# Patient Record
Sex: Male | Born: 1935 | Race: White | Hispanic: No | Marital: Married
Health system: Southern US, Community
[De-identification: ages and names within clinical notes are randomized; demographics above are authoritative.]

---

## 2005-07-09 ENCOUNTER — Ambulatory Visit: Payer: Self-pay | Admitting: Internal Medicine

## 2007-10-01 ENCOUNTER — Ambulatory Visit: Payer: Self-pay | Admitting: Family Medicine

## 2008-07-23 ENCOUNTER — Inpatient Hospital Stay: Payer: Self-pay | Admitting: Internal Medicine

## 2008-12-07 ENCOUNTER — Ambulatory Visit: Payer: Self-pay | Admitting: Ophthalmology

## 2008-12-14 ENCOUNTER — Ambulatory Visit: Payer: Self-pay | Admitting: Ophthalmology

## 2009-02-28 ENCOUNTER — Ambulatory Visit: Payer: Self-pay | Admitting: Ophthalmology

## 2010-07-13 ENCOUNTER — Ambulatory Visit: Payer: Self-pay | Admitting: Internal Medicine

## 2010-07-20 ENCOUNTER — Inpatient Hospital Stay: Payer: Self-pay | Admitting: Internal Medicine

## 2010-07-20 DIAGNOSIS — R0602 Shortness of breath: Secondary | ICD-10-CM

## 2011-03-14 ENCOUNTER — Inpatient Hospital Stay: Payer: Self-pay | Admitting: Internal Medicine

## 2011-04-05 ENCOUNTER — Ambulatory Visit: Payer: Self-pay | Admitting: Internal Medicine

## 2011-10-30 ENCOUNTER — Ambulatory Visit: Payer: Self-pay

## 2011-11-21 ENCOUNTER — Ambulatory Visit: Payer: Self-pay

## 2012-07-17 ENCOUNTER — Emergency Department: Payer: Self-pay | Admitting: Emergency Medicine

## 2013-11-12 ENCOUNTER — Ambulatory Visit: Payer: Self-pay | Admitting: Physician Assistant

## 2013-11-24 ENCOUNTER — Inpatient Hospital Stay: Payer: Self-pay | Admitting: Internal Medicine

## 2013-11-24 LAB — CBC WITH DIFFERENTIAL/PLATELET
BASOS ABS: 0 10*3/uL (ref 0.0–0.1)
Basophil %: 0.2 %
EOS PCT: 0 %
Eosinophil #: 0 10*3/uL (ref 0.0–0.7)
HCT: 44.3 % (ref 40.0–52.0)
HGB: 14.3 g/dL (ref 13.0–18.0)
LYMPHS ABS: 0.6 10*3/uL — AB (ref 1.0–3.6)
LYMPHS PCT: 3.2 %
MCH: 30.5 pg (ref 26.0–34.0)
MCHC: 32.4 g/dL (ref 32.0–36.0)
MCV: 94 fL (ref 80–100)
MONO ABS: 0.9 x10 3/mm (ref 0.2–1.0)
MONOS PCT: 4.9 %
NEUTROS ABS: 17.4 10*3/uL — AB (ref 1.4–6.5)
NEUTROS PCT: 91.7 %
Platelet: 219 10*3/uL (ref 150–440)
RBC: 4.7 10*6/uL (ref 4.40–5.90)
RDW: 14 % (ref 11.5–14.5)
WBC: 19 10*3/uL — ABNORMAL HIGH (ref 3.8–10.6)

## 2013-11-24 LAB — COMPREHENSIVE METABOLIC PANEL
ALT: 17 U/L (ref 12–78)
Albumin: 2.9 g/dL — ABNORMAL LOW (ref 3.4–5.0)
Alkaline Phosphatase: 56 U/L
Anion Gap: 6 — ABNORMAL LOW (ref 7–16)
BILIRUBIN TOTAL: 0.3 mg/dL (ref 0.2–1.0)
BUN: 20 mg/dL — ABNORMAL HIGH (ref 7–18)
CALCIUM: 9.2 mg/dL (ref 8.5–10.1)
Chloride: 102 mmol/L (ref 98–107)
Co2: 31 mmol/L (ref 21–32)
Creatinine: 0.99 mg/dL (ref 0.60–1.30)
EGFR (African American): >60
Glucose: 123 mg/dL — ABNORMAL HIGH (ref 65–99)
Osmolality: 282 (ref 275–301)
Potassium: 3.9 mmol/L (ref 3.5–5.1)
SGOT(AST): 18 U/L (ref 15–37)
SODIUM: 139 mmol/L (ref 136–145)
TOTAL PROTEIN: 6.4 g/dL (ref 6.4–8.2)

## 2013-11-24 LAB — THEOPHYLLINE LEVEL: Theophylline: 3.4 ug/mL — ABNORMAL LOW (ref 10.0–20.0)

## 2013-11-25 LAB — BASIC METABOLIC PANEL
ANION GAP: 7 (ref 7–16)
BUN: 22 mg/dL — AB (ref 7–18)
Calcium, Total: 8.6 mg/dL (ref 8.5–10.1)
Chloride: 105 mmol/L (ref 98–107)
Co2: 31 mmol/L (ref 21–32)
Creatinine: 0.95 mg/dL (ref 0.60–1.30)
EGFR (Non-African Amer.): >60
Glucose: 183 mg/dL — ABNORMAL HIGH (ref 65–99)
OSMOLALITY: 293 (ref 275–301)
Potassium: 4.1 mmol/L (ref 3.5–5.1)
Sodium: 143 mmol/L (ref 136–145)

## 2013-11-25 LAB — CBC WITH DIFFERENTIAL/PLATELET
Basophil #: 0 10*3/uL (ref 0.0–0.1)
Basophil %: 0 %
EOS ABS: 0 10*3/uL (ref 0.0–0.7)
Eosinophil %: 0 %
HCT: 40.8 % (ref 40.0–52.0)
HGB: 13.6 g/dL (ref 13.0–18.0)
LYMPHS ABS: 0.4 10*3/uL — AB (ref 1.0–3.6)
Lymphocyte %: 2.3 %
MCH: 31.4 pg (ref 26.0–34.0)
MCHC: 33.2 g/dL (ref 32.0–36.0)
MCV: 95 fL (ref 80–100)
MONO ABS: 0.3 x10 3/mm (ref 0.2–1.0)
Monocyte %: 1.9 %
NEUTROS PCT: 95.8 %
Neutrophil #: 14.7 10*3/uL — ABNORMAL HIGH (ref 1.4–6.5)
PLATELETS: 208 10*3/uL (ref 150–440)
RBC: 4.31 10*6/uL — ABNORMAL LOW (ref 4.40–5.90)
RDW: 14 % (ref 11.5–14.5)
WBC: 15.4 10*3/uL — ABNORMAL HIGH (ref 3.8–10.6)

## 2013-11-28 LAB — CREATININE, SERUM
CREATININE: 0.82 mg/dL (ref 0.60–1.30)
EGFR (Non-African Amer.): >60

## 2013-11-29 LAB — CBC WITH DIFFERENTIAL/PLATELET
BASOS ABS: 0 10*3/uL (ref 0.0–0.1)
Basophil %: 0.1 %
Eosinophil #: 0 10*3/uL (ref 0.0–0.7)
Eosinophil %: 0 %
HCT: 41.6 % (ref 40.0–52.0)
HGB: 13.4 g/dL (ref 13.0–18.0)
Lymphocyte #: 0.3 10*3/uL — ABNORMAL LOW (ref 1.0–3.6)
Lymphocyte %: 1.5 %
MCH: 30.6 pg (ref 26.0–34.0)
MCHC: 32.3 g/dL (ref 32.0–36.0)
MCV: 95 fL (ref 80–100)
MONO ABS: 0.7 x10 3/mm (ref 0.2–1.0)
Monocyte %: 4.1 %
NEUTROS PCT: 94.3 %
Neutrophil #: 16.5 10*3/uL — ABNORMAL HIGH (ref 1.4–6.5)
Platelet: 178 10*3/uL (ref 150–440)
RBC: 4.38 10*6/uL — AB (ref 4.40–5.90)
RDW: 13.9 % (ref 11.5–14.5)
WBC: 17.5 10*3/uL — AB (ref 3.8–10.6)

## 2013-11-29 LAB — BASIC METABOLIC PANEL
Anion Gap: 3 — ABNORMAL LOW (ref 7–16)
BUN: 25 mg/dL — AB (ref 7–18)
CO2: 37 mmol/L — AB (ref 21–32)
Calcium, Total: 9.6 mg/dL (ref 8.5–10.1)
Chloride: 103 mmol/L (ref 98–107)
Creatinine: 0.9 mg/dL (ref 0.60–1.30)
EGFR (African American): >60
Glucose: 117 mg/dL — ABNORMAL HIGH (ref 65–99)
OSMOLALITY: 290 (ref 275–301)
Potassium: 4 mmol/L (ref 3.5–5.1)
SODIUM: 143 mmol/L (ref 136–145)

## 2013-12-02 LAB — BASIC METABOLIC PANEL
Anion Gap: 5 — ABNORMAL LOW (ref 7–16)
BUN: 26 mg/dL — ABNORMAL HIGH (ref 7–18)
CALCIUM: 8.6 mg/dL (ref 8.5–10.1)
CHLORIDE: 102 mmol/L (ref 98–107)
CO2: 37 mmol/L — AB (ref 21–32)
CREATININE: 0.95 mg/dL (ref 0.60–1.30)
EGFR (African American): >60
GLUCOSE: 232 mg/dL — AB (ref 65–99)
Osmolality: 299 (ref 275–301)
POTASSIUM: 3.9 mmol/L (ref 3.5–5.1)
Sodium: 144 mmol/L (ref 136–145)

## 2013-12-02 LAB — CBC WITH DIFFERENTIAL/PLATELET
Basophil #: 0 10*3/uL (ref 0.0–0.1)
Basophil %: 0.1 %
EOS ABS: 0 10*3/uL (ref 0.0–0.7)
EOS PCT: 0 %
HCT: 41 % (ref 40.0–52.0)
HGB: 13.4 g/dL (ref 13.0–18.0)
LYMPHS PCT: 1.7 %
Lymphocyte #: 0.3 10*3/uL — ABNORMAL LOW (ref 1.0–3.6)
MCH: 31 pg (ref 26.0–34.0)
MCHC: 32.7 g/dL (ref 32.0–36.0)
MCV: 95 fL (ref 80–100)
Monocyte #: 0.5 x10 3/mm (ref 0.2–1.0)
Monocyte %: 3.3 %
NEUTROS PCT: 94.9 %
Neutrophil #: 15.3 10*3/uL — ABNORMAL HIGH (ref 1.4–6.5)
PLATELETS: 163 10*3/uL (ref 150–440)
RBC: 4.33 10*6/uL — AB (ref 4.40–5.90)
RDW: 14.2 % (ref 11.5–14.5)
WBC: 16.1 10*3/uL — ABNORMAL HIGH (ref 3.8–10.6)

## 2013-12-02 LAB — EXPECTORATED SPUTUM ASSESSMENT W GRAM STAIN, RFLX TO RESP C

## 2013-12-04 LAB — CBC WITH DIFFERENTIAL/PLATELET
Bands: 5 %
HCT: 44.1 % (ref 40.0–52.0)
HGB: 14.5 g/dL (ref 13.0–18.0)
LYMPHS PCT: 5 %
MCH: 31.1 pg (ref 26.0–34.0)
MCHC: 33 g/dL (ref 32.0–36.0)
MCV: 94 fL (ref 80–100)
Metamyelocyte: 3 %
Monocytes: 2 %
Platelet: 158 10*3/uL (ref 150–440)
RBC: 4.68 10*6/uL (ref 4.40–5.90)
RDW: 14 % (ref 11.5–14.5)
SEGMENTED NEUTROPHILS: 84 %
VARIANT LYMPHOCYTE - H1-RLYMPH: 1 %
WBC: 20 10*3/uL — ABNORMAL HIGH (ref 3.8–10.6)

## 2013-12-05 LAB — CBC WITH DIFFERENTIAL/PLATELET
Basophil #: 0 10*3/uL (ref 0.0–0.1)
Basophil %: 0.1 %
EOS PCT: 0 %
Eosinophil #: 0 10*3/uL (ref 0.0–0.7)
HCT: 43.1 % (ref 40.0–52.0)
HGB: 13.9 g/dL (ref 13.0–18.0)
Lymphocyte #: 0.5 10*3/uL — ABNORMAL LOW (ref 1.0–3.6)
Lymphocyte %: 2.6 %
MCH: 30.5 pg (ref 26.0–34.0)
MCHC: 32.3 g/dL (ref 32.0–36.0)
MCV: 94 fL (ref 80–100)
Monocyte #: 0.7 x10 3/mm (ref 0.2–1.0)
Monocyte %: 3.3 %
NEUTROS ABS: 18.8 10*3/uL — AB (ref 1.4–6.5)
NEUTROS PCT: 94 %
Platelet: 140 10*3/uL — ABNORMAL LOW (ref 150–440)
RBC: 4.58 10*6/uL (ref 4.40–5.90)
RDW: 14.3 % (ref 11.5–14.5)
WBC: 20 10*3/uL — ABNORMAL HIGH (ref 3.8–10.6)

## 2013-12-06 LAB — CREATININE, SERUM
Creatinine: 1.24 mg/dL (ref 0.60–1.30)
EGFR (African American): >60
EGFR (Non-African Amer.): 53 — ABNORMAL LOW

## 2013-12-24 LAB — EXPECTORATED SPUTUM ASSESSMENT W REFEX TO RESP CULTURE

## 2014-02-16 ENCOUNTER — Ambulatory Visit: Payer: Self-pay | Admitting: Internal Medicine

## 2014-10-02 NOTE — Consult Note (Signed)
Brief Consult Note: Diagnosis: severe copd, on chronic prednisone with fungal growth on sputum.   Patient was seen by consultant.   Consult note dictated.   Recommend further assessment or treatment.   Orders entered.   Discussed with Attending MD.   Comments: Check CT chest without contrast I have spoken with micro lab and being sent for further micro eval Consider  voriconazole.  Electronic Signatures: Dierdre HarnessFitzgerald, David Patrick (MD)  (Signed 23-Jun-15 15:37)  Authored: Brief Consult Note   Last Updated: 23-Jun-15 15:37 by Dierdre HarnessFitzgerald, David Patrick (MD)

## 2014-10-02 NOTE — Consult Note (Signed)
Chief Complaint:  Subjective/Chief Complaint patient seen on rounds. doing about the same. some slight improvement in cough production but still with thick tenacious sputum   VITAL SIGNS/ANCILLARY NOTES: **Vital Signs.:   21-Jun-15 04:06  Vital Signs Type Routine  Temperature Temperature (F) 99.4  Celsius 37.4  Temperature Source oral  Pulse Pulse 79  Respirations Respirations 20  Systolic BP Systolic BP 671  Diastolic BP (mmHg) Diastolic BP (mmHg) 69  Mean BP 84  Pulse Ox % Pulse Ox % 96  Pulse Ox Activity Level  At rest  Oxygen Delivery 2L  *Intake and Output.:   Shift 21-Jun-15 15:00  Grand Totals Intake:   Output:  400    Net:  -400 24 Hr.:  -400  Urine ml     Out:  400  Length of Stay Totals Intake:  2600 Output:  1900    Net:  700   Brief Assessment:  GEN cachectic, thin   Cardiac Regular  --Rub   Respiratory normal resp effort  no use of accessory muscles  rhonchi   Gastrointestinal details normal Soft  Bowel sounds normal   EXTR negative cyanosis/clubbing   Lab Results: Routine Chem:  21-Jun-15 05:05   Glucose, Serum  117  BUN  25  Creatinine (comp) 0.90  Sodium, Serum 143  Potassium, Serum 4.0  Chloride, Serum 103  CO2, Serum  37  Calcium (Total), Serum 9.6  Anion Gap  3  Osmolality (calc) 290  eGFR (African American) >60  eGFR (Non-African American) >60 (eGFR values <53m/min/1.73 m2 may be an indication of chronic kidney disease (CKD). Calculated eGFR is useful in patients with stable renal function. The eGFR calculation will not be reliable in acutely ill patients when serum creatinine is changing rapidly. It is not useful in  patients on dialysis. The eGFR calculation may not be applicable to patients at the low and high extremes of body sizes, pregnant women, and vegetarians.)  Routine Hem:  21-Jun-15 05:05   WBC (CBC)  17.5  RBC (CBC)  4.38  Hemoglobin (CBC) 13.4  Hematocrit (CBC) 41.6  Platelet Count (CBC) 178  MCV 95  MCH 30.6   MCHC 32.3  RDW 13.9  Neutrophil % 94.3  Lymphocyte % 1.5  Monocyte % 4.1  Eosinophil % 0.0  Basophil % 0.1  Neutrophil #  16.5  Lymphocyte #  0.3  Monocyte # 0.7  Eosinophil # 0.0  Basophil # 0.0 (Result(s) reported on 29 Nov 2013 at [facility])   Radiology Results: XRay:    20-Jun-15 13:40, Chest PA and Lateral  Chest PA and Lateral   REASON FOR EXAM:    pneumonia  COMMENTS:       PROCEDURE: DXR - DXR CHEST PA (OR AP) AND LATERAL  - Nov 28 2013  1:40PM     CLINICAL DATA:  pneumonia    EXAM:  CHEST  2 VIEW    COMPARISON:  Two view chest 11/24/2013    FINDINGS:  There is increased AP diameter of the chest and flattening of the  diaphragms. Interval development of an area of nodular density left  upper lobe. In centers changes are appreciated within the lungs.  Further focal reason consolidation or focal infiltrates. Cardiac  silhouette within normal limits. Atherosclerotic calcifications in  the arch of the aorta. Mild degenerative changes of the shoulders.  No acute osseous abnormalities.     IMPRESSION:  Left upper lobe infiltrate versus atelectasis.    COPD and emphysematous changes.  Electronically Signed    By: Margaree Mackintosh M.D.    On: 11/28/2013 13:47     Verified By: Mikki Santee, M.D., MD   Assessment/Plan:  Assessment/Plan:  Assessment 1. Acute on chronic respiratory failure -will continue with steroids -continue abx -continue with oxygen therapy -prognosis is poor overall with endstage COPD   Electronic Signatures: Allyne Gee (MD)  (Signed 21-Jun-15 11:46)  Authored: Chief Complaint, VITAL SIGNS/ANCILLARY NOTES, Brief Assessment, Lab Results, Radiology Results, Assessment/Plan   Last Updated: 21-Jun-15 11:46 by Allyne Gee (MD)

## 2014-10-02 NOTE — Consult Note (Signed)
Chief Complaint:  Subjective/Chief Complaint clinically doing better today less cough noted   VITAL SIGNS/ANCILLARY NOTES: **Vital Signs.:   22-Jun-15 04:00  Vital Signs Type Routine  Temperature Temperature (F) 98.9  Celsius 37.1  Temperature Source oral  Pulse Pulse 80  Respirations Respirations 20  Systolic BP Systolic BP 112  Diastolic BP (mmHg) Diastolic BP (mmHg) 67  Mean BP 82  Pulse Ox % Pulse Ox % 94  Pulse Ox Activity Level  At rest  Oxygen Delivery 2L  *Intake and Output.:   Shift 22-Jun-15 15:00  Grand Totals Intake:  240 Output:      Net:  240 24 Hr.:  240  Oral Intake      In:  240  Length of Stay Totals Intake:  3363 Output:  1900    Net:  1463   Brief Assessment:  GEN cachectic, thin   Cardiac Regular  no murmur  -- LE edema   Respiratory normal resp effort  rhonchi   Gastrointestinal details normal Soft  Nontender  No gaurding   EXTR negative cyanosis/clubbing   Assessment/Plan:  Assessment/Plan:  Assessment 1. Acute reapiratory failure -will continue with oxygen therapy -continue with steroids  2. COPD exacerbation -slow to improve -continue with present management   Electronic Signatures: Yevonne PaxKhan, Saadat A (MD)  (Signed 22-Jun-15 13:48)  Authored: Chief Complaint, VITAL SIGNS/ANCILLARY NOTES, Brief Assessment, Assessment/Plan   Last Updated: 22-Jun-15 13:48 by Yevonne PaxKhan, Saadat A (MD)

## 2014-10-02 NOTE — H&P (Signed)
PATIENT NAME:  Luis Knight, Luis Knight MR#:  [id number] DATE OF BIRTH:  1928-02-07  DATE OF ADMISSION:  11/24/2013  PRIMARY CARE PROVIDER: Yevonne Pax, MD  REFERRING PHYSICIAN: Yevonne Pax, MD  REASON FOR REFERRAL:  Shortness of breath, acute respiratory failure.   HISTORY OF PRESENT ILLNESS: The patient is an 81 year old white male with history of chronic respiratory failure, on 2 liters of oxygen at all times, who has been having difficulty with shortness of breath, ongoing for the past 2 months. The patient has been treated with 2 rounds of steroids and antibiotics. The patient reports that the shortness of breath has progressively gotten worse. Now, he gets short of breath even with minimal exertion. The patient also has had a productive cough of yellow and dark-colored sputum. He has not had any fevers or chills and has not had any chest pain. The patient was seen by Dr. Park Breed today and, due to him not improving, he was referred here for further evaluation. The patient reports that normally he is able to ambulate but since being sick over the past few weeks, he has gotten more progressively weak.   PAST MEDICAL HISTORY: Significant for anxiety disorder, chronic obstructive pulmonary disease, history of benign prostatic hypertrophy, history of cancer of the prostate. History of chronic respiratory failure, on 2 liters of O2 at all times. Osteoporosis. History of hypertension.    PAST SURGICAL HISTORY: Status post appendectomy and cataract bilateral.    ALLERGIES: None.   CURRENT MEDICATIONS: He is on calcium plus vitamin D 600 mg/125 p.o. daily, Theo-Dur 100 mg 1 tab p.o. daily, albuterol inhaler 1 to 2 puffs every 4 to 6 hours as needed, Remeron 45 mg at bedtime, iron sulfate 140 mg 1 tab p.o. daily, DuoNebs 4 times a day as needed, Prevacid 30 daily, Lasix 20 every other day, Megace 20 mL p.o. daily, prednisone 5 mg daily, Advair 250/50 one puff b.i.d., Mucinex 1200 mg p.o. b.i.d., nystatin 5 mL  swish-and-swallow 4 times a day, alprazolam 0.5 one tab p.o. daily p.r.n. for anxiety, Coreg 3.125 one tab p.o. b.i.d., ropinirole 0.25 one tab p.o. at bedtime, cefdinir 300 mg 1 tab p.o. b.i.d., cetirizine 10 daily, prednisone taper.   SOCIAL HISTORY: History of smoking, none now. No alcohol or drug use.   FAMILY HISTORY: Positive for lymphatic cancer, chronic obstructive pulmonary disease and coronary artery disease.   REVIEW OF SYSTEMS:    GENERAL: He complains of fatigue, weakness. No fevers, no weight loss, no weight gain.  HEENT: Denies any visual difficulties, blurred vision.  Has history of cataracts but no glaucoma. Ears: Denies any hearing loss. No tinnitus. Nose and sinuses: Denies any nasal stuffiness. No postnasal drip. No seasonal or year-round allergies. No difficulty swallowing.  RESPIRATORY: Complains of productive cough, dyspnea on exertion. No hemoptysis. Has shortness of breath. Complains of wheezing. Has chronic obstructive pulmonary disease.  CARDIOVASCULAR: Denies any chest pain, palpitations or syncope.  GASTROINTESTINAL: Denies any nausea, vomiting or diarrhea.  GENITOURINARY: Denies any urinary frequency, urgency or hesitancy. Has a history of benign prostatic hypertrophy.  PSYCHIATRIC: Denies any anxiety and depression.  NEUROLOGIC: Denies any numbness, tingling or dizziness.   PHYSICAL EXAMINATION: VITAL SIGNS: Temperature 98.3, pulse 98, respirations 20, blood pressure 155/90, O2 97% on 2 liters.  GENERAL: The patient is a thin male, appears in no acute distress.  HEENT: Head atraumatic, normocephalic. Pupils equally round, reactive to light and accommodation. There is no conjunctival pallor. No scleral icterus. Nasal exam shows  no drainage or ulceration. Oropharynx is clear without any exudate.  NECK: Supple without any JVD.  CARDIOVASCULAR: Regular rate and rhythm. No murmurs, rubs, clicks or gallops.  LUNGS: Bilateral wheezing. There is no accessory muscle usage.  There is no rhonchi or crackles.  EXTREMITIES: No clubbing, cyanosis or edema.  SKIN: No rash.  LYMPHATICS: No lymph nodes palpable.  VASCULAR: Good DP, PT pulses. No lymph node enlargements.   ASSESSMENT AND PLAN: The patient is an 81 year old white male with chronic respiratory failure referred by Dr. Welton FlakesKhan of pulmonary for shortness of breath, cough, failure to improve.  1.  Acute on chronic obstructive pulmonary flare. We will treat him with nebulizers, steroids and we will continue the Advair and the theophylline. We will also continue the Mucinex. He was on Daliresp, which the hospital does not have.  I will start him on Spiriva.  2.  Acute bronchitis, possible pneumonia. We will go ahead and treat him with IV antibiotics with IV Rocephin and doxycycline.  Dr. Welton FlakesKhan wanted the patient to be on double antibiotic coverage. We will also check a chest x-ray.  3.  Anxiety disorder. We will continue Xanax as taking at home as well as Remeron.  4.  Hypertension. We will continue Coreg as taking at home.  5.  Miscellaneous. The patient will be on Lovenox for deep vein thrombosis prophylaxis.   TIME SPENT: 50 minutes on this patient.    ____________________________ Lacie ScottsShreyang H. Allena KatzPatel, MD shp:cs D: 11/24/2013 17:28:00 ET T: 11/24/2013 18:21:55 ET JOB#: [id number]  cc: Shreyang H. Allena KatzPatel, MD, <Dictator> Charise CarwinSHREYANG H PATEL MD ELECTRONICALLY SIGNED 11/29/2013 8:39

## 2014-10-02 NOTE — Consult Note (Signed)
PATIENT NAME:  Luis Knight, Luis Knight MR#:  [id number] DATE OF BIRTH:  19-Nov-1927  DATE OF CONSULTATION:  12/01/2013  REFERRING PHYSICIAN:  Hope PigeonVaibhavkumar G. Elisabeth PigeonVachhani, MD CONSULTING PHYSICIAN:  Stann Mainlandavid P. Sampson GoonFitzgerald, MD  REASON FOR CONSULTATION:  Chronic obstructive pulmonary disease exacerbation, pneumonia and fungal growth on sputum sample.   HISTORY OF PRESENT ILLNESS: This is an 81 year old gentleman with history of chronic respiratory failure, follows with Dr. Welton FlakesKhan, treated on 2 liters at home. He apparently has been on oxygen for over 6 years. He is on chronic steroids. He has been having increasing shortness of breath and cough for 2 months. He has received multiple rounds of antibiotics and steroids without improvement. He has also had cough productive of dark-colored sputum. The patient was admitted with these symptoms on June 16th. Since then, he has been covered with broad-spectrum antibiotics. He has been seen by Dr. Welton FlakesKhan. Sputum culture was done which is growing Pseudomonas as well as a fungus that is yet to be identified.   PAST MEDICAL HISTORY: 1.  Chronic obstructive pulmonary disease, on home O2.  2.  Anxiety disorder.  3.  Benign prostatic hypertrophy.  4.  Prostate cancer.  5.  Chronic respiratory failure.  6.  Osteoporosis.  7.  Hypertension.   PAST SURGICAL HISTORY: Appendectomy, cataract surgery.   ALLERGIES: No known drug allergies.   SOCIAL HISTORY: Prior smoking.  Unknown alcohol or drug use. He is married. He used to work with [employer] and [employer] with [employer]. He did serve in [employer] and was overseas in [place] for many years. He has a pet dog but no other pets and no bird exposure. He has no recent travel outside of the country. No sick contacts.   ANTIBIOTICS SINCE ADMISSION: Include ceftriaxone June 16th through 18th, doxycycline June 16th through the 22nd, Zosyn begun June 19th through the 22nd. He is also on Solu-Medrol.   REVIEW OF SYSTEMS:  Eleven systems reviewed and negative except as per history of present illness.   PHYSICAL EXAMINATION: VITAL SIGNS: Temperature 98, pulse 83, blood pressure 110/68, respirations 20, sat 97% on 2 liters.  GENERAL: He is quite thin. No acute distress but is on oxygen.  HEENT: Pupils equal, round, reactive to light and accommodation. Extraocular movements are intact. Sclerae anicteric. Oropharynx clear.  NECK: Supple. No anterior cervical, posterior cervical or supraclavicular lymphadenopathy.  HEART: Regular.  LUNGS: Very poor breath sounds bilaterally and rhonchi.  ABDOMEN: Soft, nontender, nondistended. No hepatosplenomegaly.  EXTREMITIES: Has 1+ edema bilateral lower extremity.  SKIN: He has extensive ecchymoses and chronic skin changes.  NEUROLOGIC: He is alert and oriented x 3. Grossly nonfocal neuro exam.   LABORATORY, DIAGNOSTIC AND RADIOLOGICAL DATA:  White blood count on admission 19.0, down to 17.4 currently, hemoglobin 13.4, platelets 178. Renal function is normal. LFTs normal except albumin low at 2.9. Sputum cultures from June 18th reveal Pseudomonas pan sensitive as well as Enterobacter cloacae and fungus yet to be isolated. Imaging: Chest x-ray done on the 20th showed left upper lobe infiltrate versus atelectasis and chronic obstructive pulmonary disease and emphysematous changes.   IMPRESSION: An 81 year old gentleman with chronic obstructive pulmonary disease, on chronic home O2 as well as prednisone. He has an exacerbation of 2 months, not responsive to outpatient antibiotics and steroids. He has got a fungus on his culture as well as Pseudomonas and Enterobacter.   RECOMMENDATIONS: 1.  Continue current antibiotics.  2.  A CT scan of the chest to evaluate  for evidence of invasive fungal disease or aspergilloma.  3.  I have asked the lab to send the organism for further ID.  4.  Consider voriconazole which can be given orally based on the above workup.   Thank you for the  consult. I will be glad to follow with you.    ____________________________ Stann Mainland. Sampson Goon, MD dpf:cs D: 12/01/2013 16:31:00 ET T: 12/01/2013 18:44:39 ET JOB#: [id number]  cc: Stann Mainland. Sampson Goon, MD, <Dictator> DAVID Sampson Goon MD ELECTRONICALLY SIGNED 12/03/2013 13:31

## 2014-10-02 NOTE — Consult Note (Signed)
Chief Complaint:  Subjective/Chief Complaint on isolation for cavitary lesion. patient has been coughing up thick sputum with no blood. he states that he is gfeeling a little better   VITAL SIGNS/ANCILLARY NOTES: **Vital Signs.:   27-Jun-15 07:56  Vital Signs Type Q 8hr  Temperature Temperature (F) 98.2  Celsius 36.7  Temperature Source oral  Pulse Pulse 75  Respirations Respirations 18  Systolic BP Systolic BP 114  Diastolic BP (mmHg) Diastolic BP (mmHg) 73  Mean BP 86  Pulse Ox % Pulse Ox % 97  Pulse Ox Activity Level  At rest  Oxygen Delivery 2L  *Intake and Output.:   Shift 27-Jun-15 07:00  Grand Totals Intake:  491 Output:  500    Net:  -9 24 Hr.:  -9  Oral Intake      In:  240  IV (Primary)      In:  251  Urine ml     Out:  500  Length of Stay Totals Intake:  4692 Output:  4270    Net:  422   Brief Assessment:  GEN no acute distress, cachectic, thin   Cardiac Regular  --Gallop   Respiratory postive use of accessory muscles  rhonchi   Gastrointestinal details normal Soft  Nontender   EXTR negative cyanosis/clubbing   Lab Results: Routine Hem:  27-Jun-15 05:27   WBC (CBC)  20.0  RBC (CBC) 4.58  Hemoglobin (CBC) 13.9  Hematocrit (CBC) 43.1  Platelet Count (CBC)  140  MCV 94  MCH 30.5  MCHC 32.3  RDW 14.3  Neutrophil % 94.0  Lymphocyte % 2.6  Monocyte % 3.3  Eosinophil % 0.0  Basophil % 0.1  Neutrophil #  18.8  Lymphocyte #  0.5  Monocyte # 0.7  Eosinophil # 0.0  Basophil # 0.0 (Result(s) reported on 05 Dec 2013 at [facility]   Radiology Results: CT:    23-Jun-15 16:07, CT Chest Without Contrast  CT Chest Without Contrast   REASON FOR EXAM:    copd on chronic prednison now with mold growing in   sputum - eval for possible in  COMMENTS:       PROCEDURE: CT  - CT CHEST WITHOUT CONTRAST  - Dec 01 2013  4:07PM     CLINICAL DATA:  Acute bronchitis.  Left upper lobe pneumonia.  COPD.    EXAM:  CT CHEST WITHOUT  CONTRAST    TECHNIQUE:  Multidetector CT imaging of the chest was performed following the  standard protocol without IV contrast..  COMPARISON:  Chest x-ray 11/28/2013.    FINDINGS:  There is a cavitary lesion within the left upper lobe with air-fluid  level. This measures 2 x 2 cm on image 16. There is bronchiectasis  leading up to this cavitary lesion.    In addition, ground-glass opacity noted peripherally in the left  upper lobe on image 29 measures 11 mm. Spiculated nodular density in  the left lower lobe on image 31 measures 10 mm.    Secretions/ mucus layering and filling the right lower lobe bronchi.  No confluent opacity on the right. No pleural effusions. Moderate  centrilobular emphysema.  Heart is normal size. Aorta is normal caliber. Coronary artery  calcifications and aortic calcifications. No mediastinal, hilar, or  axillary adenopathy. Chest wall soft tissues are unremarkable.  Imaging into the upper abdomen shows no acute findings. Layering  high dense material in the gallbladder, likely small stones.     IMPRESSION:  2 cm cavitary lesion in the left  upper lobe with air-fluid level and  adjacent bronchiectasis. I favor this is infectious although  cavitary lung cancer cannot be completely excluded.    11 mm ground-glass opacity in the peripheral left upper lobe and 10  mm spiculated nodule in the left lower lobe. These will require  followup. Recommend initial follow-up in 3 months. The left upper  lobe cavitary lesion could alsobe reassessed at that time and after  appropriate antimicrobial therapy.      Electronically Signed    By: Charlett NoseKevin  Dover M.D.    On: 12/01/2013 16:24         Verified By: Cyndie ChimeKEVIN G. DOVER, M.D.,   Assessment/Plan:  Assessment/Plan:  Assessment 1. Cavitary lung lesion ?etiology -will get a follow up CXR -continue with antibiotics -f/u AFB results   Electronic Signatures: Yevonne PaxKhan, Saadat A (MD)  (Signed 27-Jun-15  13:50)  Authored: Chief Complaint, VITAL SIGNS/ANCILLARY NOTES, Brief Assessment, Lab Results, Radiology Results, Assessment/Plan   Last Updated: 27-Jun-15 13:50 by Yevonne PaxKhan, Saadat A (MD)

## 2014-10-02 NOTE — Discharge Summary (Signed)
PATIENT NAME:  Luis Knight, Luis Knight MR#:  [id number] DATE OF BIRTH:  10-29-1927  DATE OF ADMISSION:  11/24/2013 DATE OF DISCHARGE:  12/07/2013  Please see interim discharge summary dictated on 12/01/2013 by Dr. Elisabeth PigeonVachhani.   FINAL DIAGNOSES: 1.  Cavitary lung lesion with pneumonia.  2.  End-stage chronic obstructive pulmonary disease on chronic oxygen.  3.  Anxiety.  4.  Hypertension.  5.  Thrush.    MEDICATIONS ON DISCHARGE: Include theophylline 100 mg extended-release 1 tablet daily, albuterol ipratropium inhaler every six hours, Lasix 20 mg every 48 hours, Coreg 3.125 mg twice a day, calcium and vitamin D 600 mg/200 international units 1 tablet daily, Remeron 45 mg once a day as needed for sleep, ferrous sulfate extended release 45 mg 1 tablet daily, DuoNeb nebulizer solution 3 mL nebulizer solution inhaled 4 times a day as needed for shortness of breath, Prevacid 30 mg 1 capsule daily, Megace 40 mg/mL 20 mL once a day, prednisone 5 mg daily, Advair Diskus 250/50, 1 puff twice a day, Xanax 0.5 mg 1 tablet daily, Requip 0.25 mg 3 times a day, Zyrtec 10 mg daily, nystatin swish and swallow 5 mL 4 times a day for 10 days, voriconazole 200 mg 1 tablet every 12 hours for 14 more days, Colace/senna 50/8.6, 1 tablet twice a day, ciprofloxacin 750 mg 1 tablet every 12 hours for 14 days, Ensure 240 mL twice a day for 30 days.   HOME HEALTH: Yes. Physical therapy, nurse, help with strength and meds.   OXYGEN: 3 liters nasal cannula.   DIET: Low sodium diet, regular consistency.   FOLLOWUP: With Dr. Sampson GoonFitzgerald, infectious disease, in two weeks; 1 to 2 weeks with Dr. Freda MunroSaadat Khan.   HOSPITAL COURSE: Please see interim discharge summary dictated by Dr. Elisabeth PigeonVachhani on June 23. This will be the 24th onward. The sputum culture that grew out Pseudomonas, rare Enterobacter and light growth of fungus, which was sent down to the state lab for identification. CT scan of the chest on June 23 showed a 2 cm cavitary lesion,  left upper lobe, with air fluid level and adjacent bronchiectasis, 11 mm ground-glass opacity in the peripheral left upper lobe and a 10 mm spiculated nodule in the left lower lobe. This is recommend for follow-up CT scan in three months. Cryptococcus antigen was negative. QuantiFERON gold was indeterminate. Three acid-fast sputum cultures were negative. Creatinine upon discharge 1.24. White blood cell count upon discharge 20, hemoglobin 13.9, platelet count 140.   HOSPITAL COURSE PER PROBLEM LIST:  1.  For the cavitary lung lesion and pneumonia, the patient was initially started on Rocephin and doxycycline. Antibiotics were not switched over to Zosyn until the sputum culture had come back, and those results were done on June 22. Dr. Sampson GoonFitzgerald recommended three weeks of antibiotics. Antibiotic time was counted from the 22nd, so the patient will need another 14 days upon discharge. The Cipro will cover the pseudomonas and the Enterobacter. The patient is on voriconazole for fungus. Follow up with Dr. Sampson GoonFitzgerald and Dr. Welton FlakesKhan as an outpatient. Will end up needing a CT scan of the chest in three months. Overall prognosis poor secondary to end-stage chronic obstructive pulmonary disease.  2.  End-stage chronic obstructive pulmonary disease, on chronic oxygen and chronic prednisone. The patient was moving better air upon discharge. No wheeze was heard. Continue usual inhalers.  3.  Anxiety. The patient is on Xanax.  4.  Hypertension. Blood pressure a little bit on the lower side. The Norvasc  that was started here in the hospital was stopped. He can go back on his usual Coreg dose.  5.  Thrush. The voriconazole should cover, but the patient also had swish and swallow which he liked. I will send the patient home on that also.   Close clinical follow-up needed. The patient was set up with home health through [facility], where he gets his oxygen from. Overall prognosis is poor. The patient is a full code.    ____________________________ Herschell Dimes. Renae Gloss, MD rjw:cg D: 12/07/2013 16:01:30 ET T: 12/08/2013 02:53:04 ET JOB#: [id number]  cc: Herschell Dimes. Renae Gloss, MD, <Dictator> Stann Mainland. Sampson Goon, MD Yevonne Pax, MD Salley Scarlet MD ELECTRONICALLY SIGNED 12/10/2013 16:19

## 2015-03-23 ENCOUNTER — Other Ambulatory Visit: Payer: Self-pay | Admitting: Internal Medicine

## 2015-03-23 DIAGNOSIS — R911 Solitary pulmonary nodule: Secondary | ICD-10-CM

## 2015-03-28 ENCOUNTER — Ambulatory Visit
Admission: RE | Admit: 2015-03-28 | Discharge: 2015-03-28 | Disposition: A | Discharge status: Home / Self Care | Source: Ambulatory Visit | Attending: Internal Medicine | Admitting: Internal Medicine

## 2015-03-28 DIAGNOSIS — R911 Solitary pulmonary nodule: Secondary | ICD-10-CM

## 2015-03-28 DIAGNOSIS — I2510 Atherosclerotic heart disease of native coronary artery without angina pectoris: Secondary | ICD-10-CM | POA: Insufficient documentation

## 2015-03-28 DIAGNOSIS — J432 Centrilobular emphysema: Secondary | ICD-10-CM | POA: Diagnosis not present

## 2015-03-28 DIAGNOSIS — J479 Bronchiectasis, uncomplicated: Secondary | ICD-10-CM | POA: Diagnosis not present

## 2015-12-14 ENCOUNTER — Other Ambulatory Visit: Payer: Self-pay | Admitting: Physician Assistant

## 2015-12-14 DIAGNOSIS — R911 Solitary pulmonary nodule: Secondary | ICD-10-CM

## 2015-12-20 ENCOUNTER — Ambulatory Visit
Admission: RE | Admit: 2015-12-20 | Discharge: 2015-12-20 | Disposition: A | Discharge status: Home / Self Care | Source: Ambulatory Visit | Attending: Physician Assistant | Admitting: Physician Assistant

## 2015-12-20 DIAGNOSIS — R911 Solitary pulmonary nodule: Secondary | ICD-10-CM | POA: Diagnosis not present

## 2016-02-14 ENCOUNTER — Other Ambulatory Visit: Payer: Self-pay

## 2016-05-11 DEATH — deceased

## 2018-05-28 IMAGING — CT CT CHEST W/O CM
3 of 8 series · 16 of 36 positions shown, 19 images · non-contrast
Comparison: 03/28/2015

CLINICAL DATA: History squamous cell carcinoma, follow-up nodule.

EXAM:
CT CHEST WITHOUT CONTRAST
TECHNIQUE: Multidetector CT imaging of the chest was performed following the
standard protocol without IV contrast.

[Series 2: soft tissue · axial · 0.70mm/px · z∈[-448,-243]mm · 4 of 69 slices shown]
[im 14/69  mediastinal]
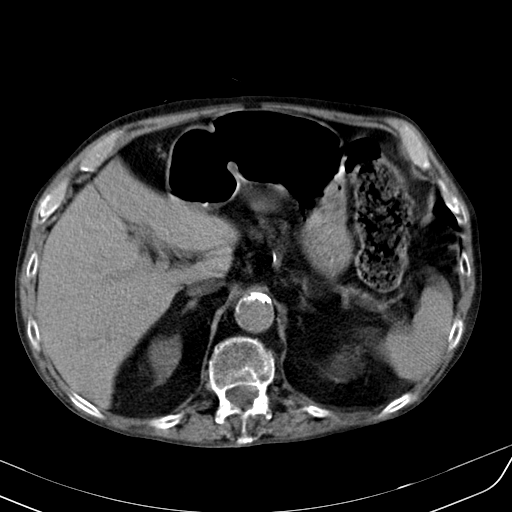
[im 28/69  mediastinal]
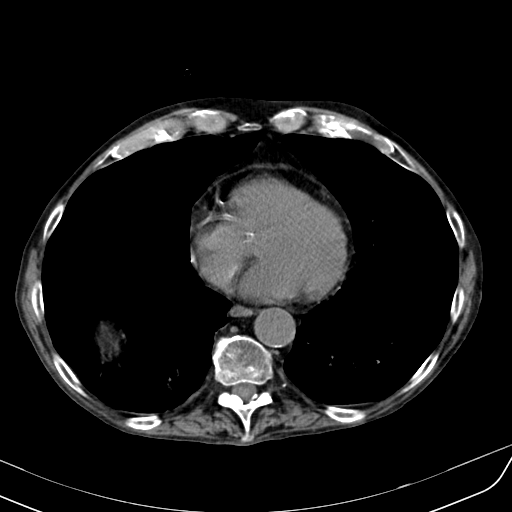
[im 41/69  mediastinal]
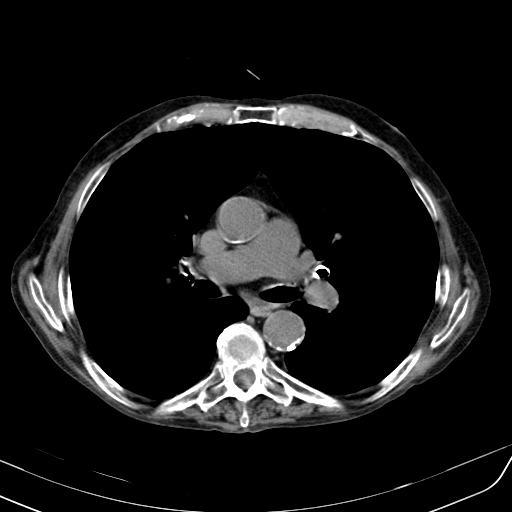
[im 55/69  mediastinal]
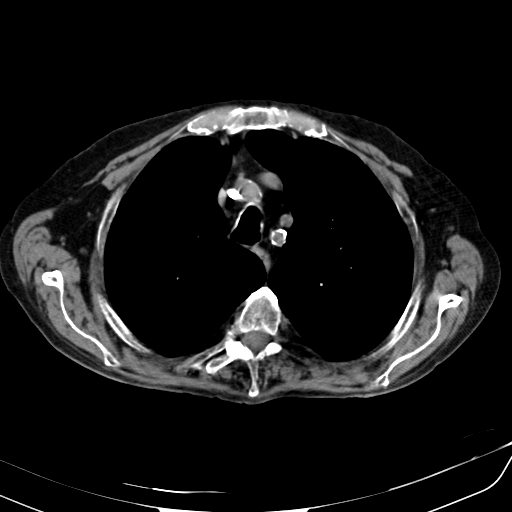

[Series 3: lungs · axial · 0.70mm/px · z∈[-480,-208]mm · 11 of 164 slices shown, 14 images]
[im 14/164  mediastinal]
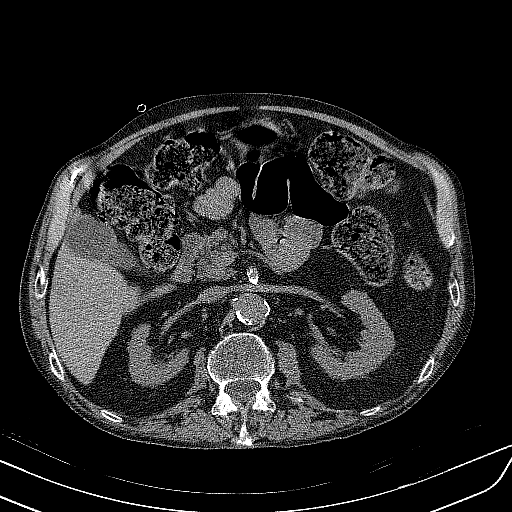
[im 14/164  lung]
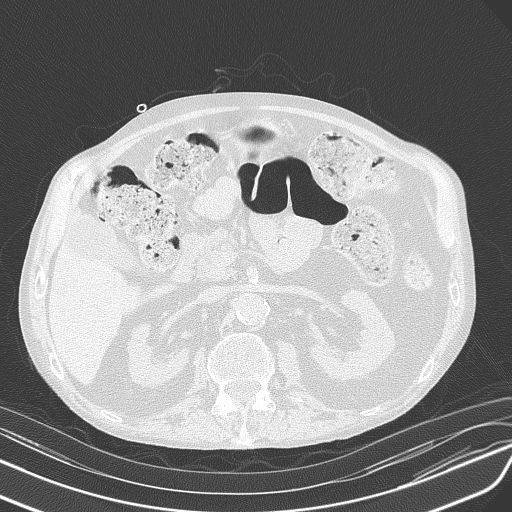
[im 28/164  lung]
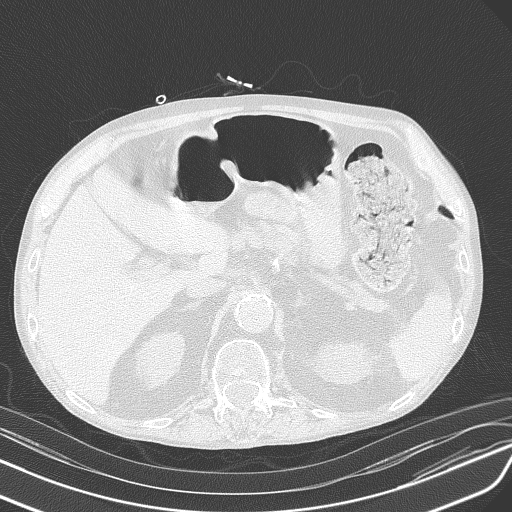
[im 41/164  lung]
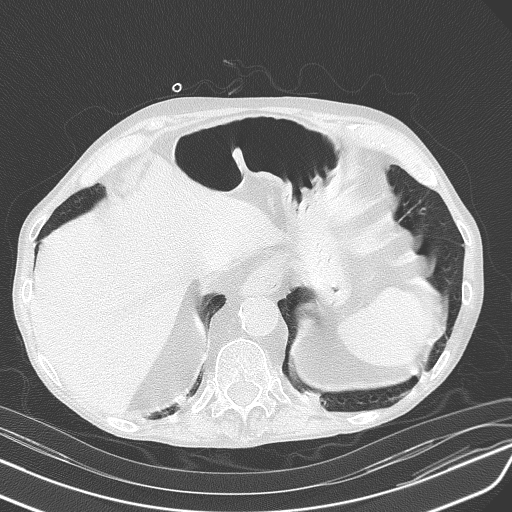
[im 55/164  lung]
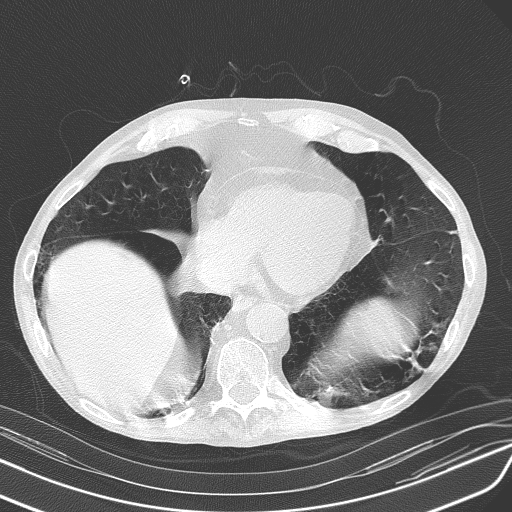
[im 68/164  mediastinal]
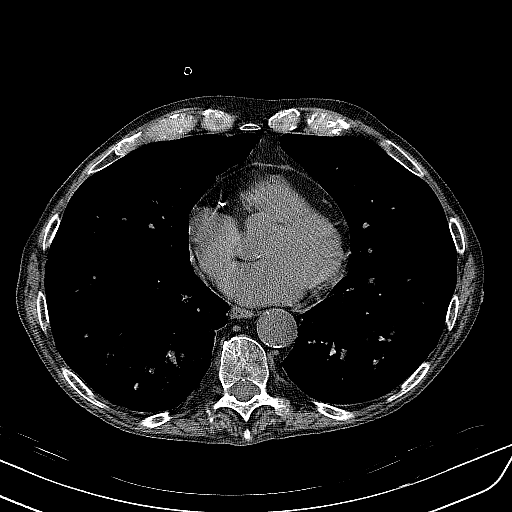
[im 68/164  lung]
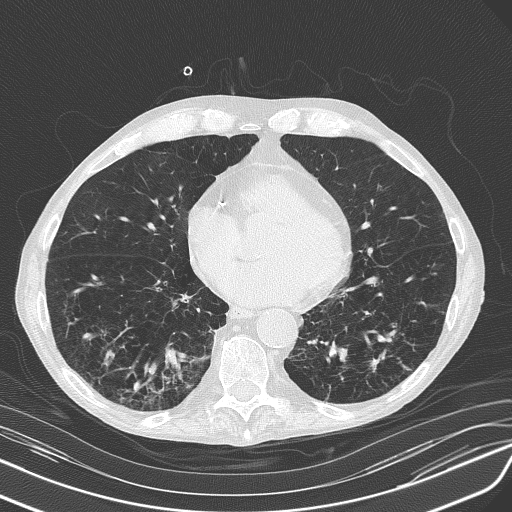
[im 82/164  lung]
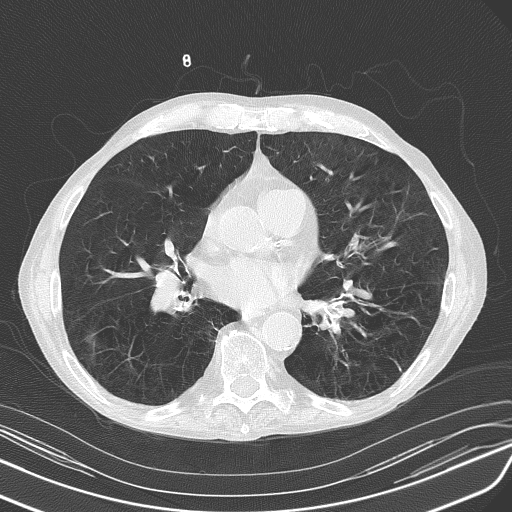
[im 96/164  lung]
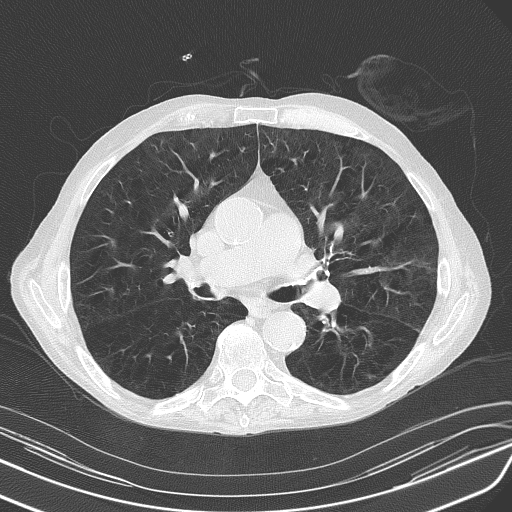
[im 109/164  lung]
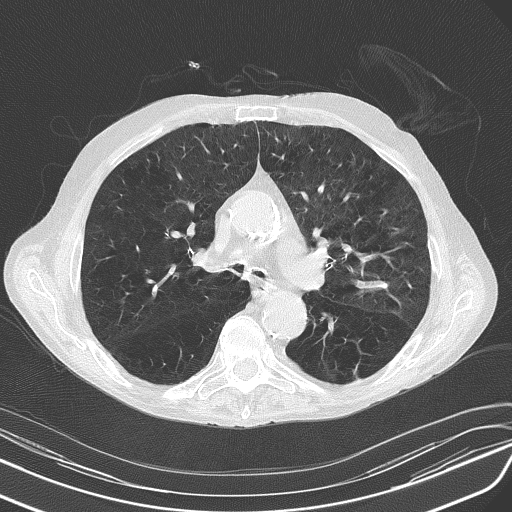
[im 123/164  mediastinal]
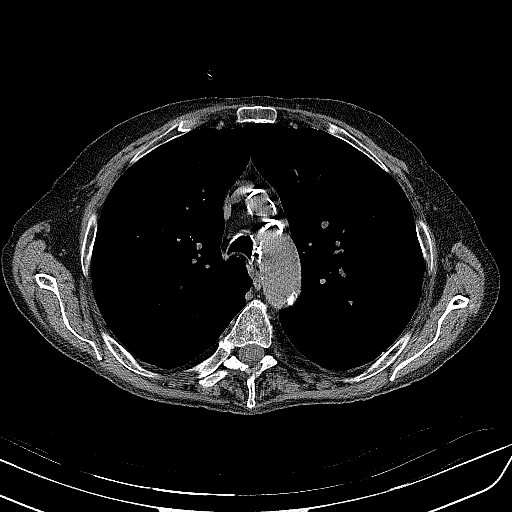
[im 123/164  lung]
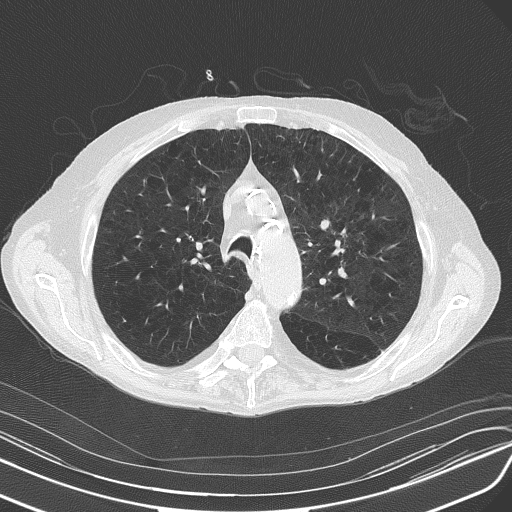
[im 136/164  lung]
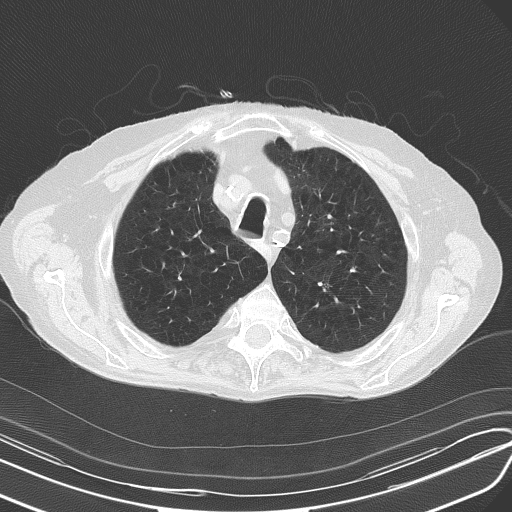
[im 150/164  lung]
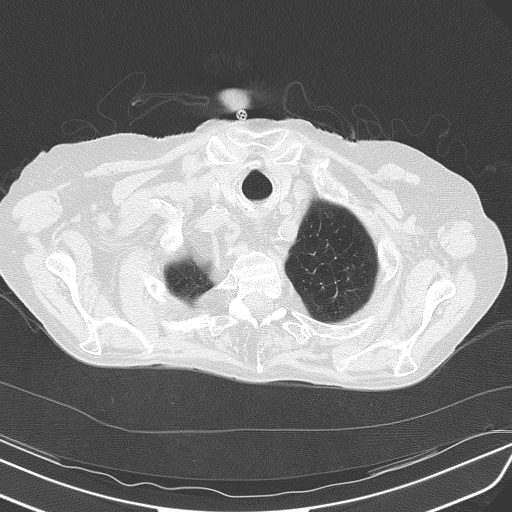

[Series 602: coronal · coronal · 0.70mm/px · 1 of 136 slices shown]
[im 68/136  lung]
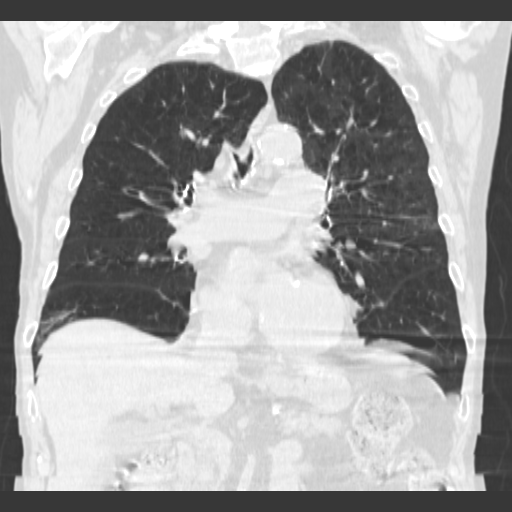

[16 of 36 positions shown; findings below may reference images not displayed]

FINDINGS: Cardiovascular: Limited evaluation of the of the mediastinal
vascular structures without IV contrast. No gross abnormality.
Coronary artery calcification and aortic atherosclerotic
calcification.

Mediastinum/Nodes: No axillary or supraclavicular lymphadenopathy.
No mediastinal hilar lymphadenopathy. No pericardial fluid.

Lungs/Pleura: Extensive centrilobular emphysema. There is reticular
pattern in the lower lobes without subpleural cystic changes or
significant architectural distortion.

Nodular lesion measuring 8 mm in LEFT lower lobe is increased in
size from prior (image 90, series 3) measuring 4 mm. Linear nodular
thickening thickening measuring 7 mm by 20 mm in the LEFT lower lobe
on image 95, series 2) corresponds semi-solid nodule on comparison
exam measuring 23 by 14 mm on prior. This lesion measures smaller
but there is increase in thickness / density.

The LEFT upper lobe ground-glass nodule is not changed measuring 15
mm.

Upper Abdomen: Limited view of the liver, kidneys, pancreas are
unremarkable. Normal adrenal glands.

Musculoskeletal: No aggressive osseous lesion.
IMPRESSION: Increased in thickness / density of 2 nodular lesions in the LEFT
lower lobe. Recommend short-term follow-up (CT without contrast) in
3 months.

Stable LEFT upper lobe ground-glass nodule.
